# Patient Record
Sex: Male | Born: 1950 | Race: White | Hispanic: No | Smoking: Former smoker
Health system: Southern US, Community
[De-identification: ages and names within clinical notes are randomized; demographics above are authoritative.]

## PROBLEM LIST (undated history)

## (undated) DIAGNOSIS — R531 Weakness: Secondary | ICD-10-CM

## (undated) DIAGNOSIS — R2681 Unsteadiness on feet: Secondary | ICD-10-CM

## (undated) DIAGNOSIS — R4586 Emotional lability: Secondary | ICD-10-CM

## (undated) DIAGNOSIS — E785 Hyperlipidemia, unspecified: Secondary | ICD-10-CM

## (undated) DIAGNOSIS — F329 Major depressive disorder, single episode, unspecified: Secondary | ICD-10-CM

## (undated) DIAGNOSIS — B070 Plantar wart: Secondary | ICD-10-CM

## (undated) DIAGNOSIS — E039 Hypothyroidism, unspecified: Secondary | ICD-10-CM

## (undated) DIAGNOSIS — E119 Type 2 diabetes mellitus without complications: Secondary | ICD-10-CM

## (undated) DIAGNOSIS — R609 Edema, unspecified: Secondary | ICD-10-CM

## (undated) DIAGNOSIS — G629 Polyneuropathy, unspecified: Secondary | ICD-10-CM

## (undated) DIAGNOSIS — I671 Cerebral aneurysm, nonruptured: Secondary | ICD-10-CM

## (undated) DIAGNOSIS — I10 Essential (primary) hypertension: Secondary | ICD-10-CM

## (undated) DIAGNOSIS — F32A Depression, unspecified: Secondary | ICD-10-CM

## (undated) HISTORY — PX: FOOT SURGERY: SHX648

## (undated) HISTORY — DX: Hypothyroidism, unspecified: E03.9

## (undated) HISTORY — DX: Unsteadiness on feet: R26.81

## (undated) HISTORY — DX: Type 2 diabetes mellitus without complications: E11.9

## (undated) HISTORY — DX: Cerebral aneurysm, nonruptured: I67.1

## (undated) HISTORY — DX: Hyperlipidemia, unspecified: E78.5

## (undated) HISTORY — DX: Emotional lability: R45.86

## (undated) HISTORY — DX: Edema, unspecified: R60.9

## (undated) HISTORY — DX: Weakness: R53.1

## (undated) HISTORY — DX: Depression, unspecified: F32.A

## (undated) HISTORY — DX: Polyneuropathy, unspecified: G62.9

## (undated) HISTORY — PX: OTHER SURGICAL HISTORY: SHX169

## (undated) HISTORY — PX: CERVICAL FUSION: SHX112

## (undated) HISTORY — DX: Plantar wart: B07.0

## (undated) HISTORY — PX: WISDOM TOOTH EXTRACTION: SHX21

## (undated) HISTORY — DX: Essential (primary) hypertension: I10

---
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
## 2005-11-01 ENCOUNTER — Inpatient Hospital Stay (HOSPITAL_COMMUNITY): Admission: EM | Admit: 2005-11-01 | Discharge: 2005-11-12 | Payer: Self-pay | Admitting: Emergency Medicine

## 2005-11-03 DIAGNOSIS — I619 Nontraumatic intracerebral hemorrhage, unspecified: Secondary | ICD-10-CM

## 2005-11-03 HISTORY — DX: Nontraumatic intracerebral hemorrhage, unspecified: I61.9

## 2005-12-12 ENCOUNTER — Encounter: Admission: RE | Admit: 2005-12-12 | Discharge: 2005-12-12 | Payer: Self-pay | Admitting: Neurosurgery

## 2006-07-15 ENCOUNTER — Encounter: Admission: RE | Admit: 2006-07-15 | Discharge: 2006-07-15 | Payer: Self-pay | Admitting: Neurosurgery

## 2006-07-23 ENCOUNTER — Ambulatory Visit (HOSPITAL_COMMUNITY): Admission: RE | Admit: 2006-07-23 | Discharge: 2006-07-23 | Payer: Self-pay | Admitting: Radiology

## 2008-01-15 IMAGING — XA IR ANGIO/CAROTID/CERV BI
1 series · 16 of 24 positions shown · non-contrast
Comparison: 11/12/05.

CLINICAL DATA: Evaluate JEBSEN.  
FOUR VESSEL CEREBRAL ANGIOGRAM 07/23/06:
Anesthesia:  Moderate sedation was provided with 1 mg Versed and 25 mcg fentanyl.  
Radiation exposure:  8.9 minutes of fluoroscopy time was utilized.

[Series 1: run · 16 of 176 slices shown]
[im 1/176]
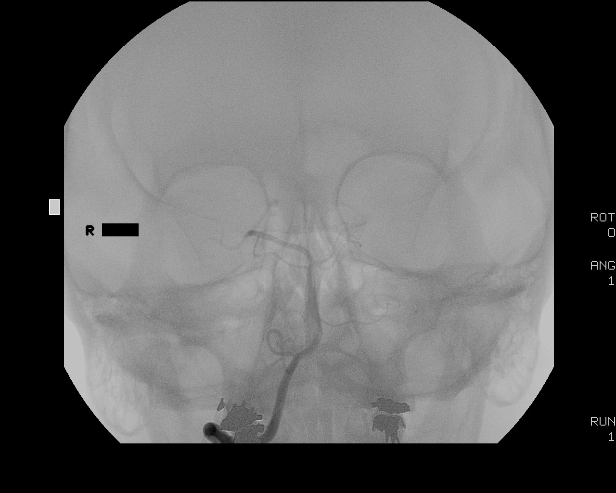
[im 16/176]
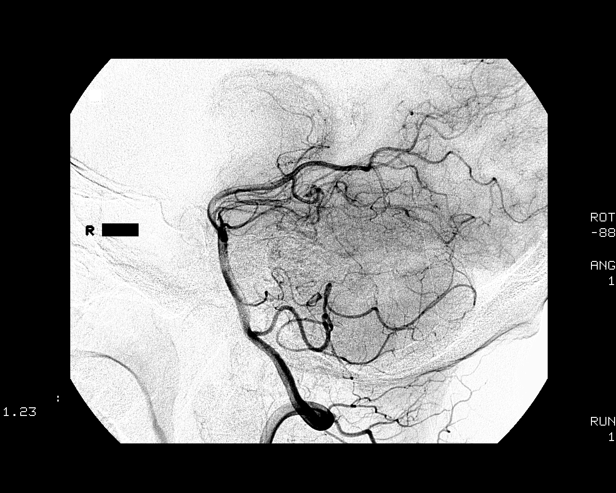
[im 23/176]
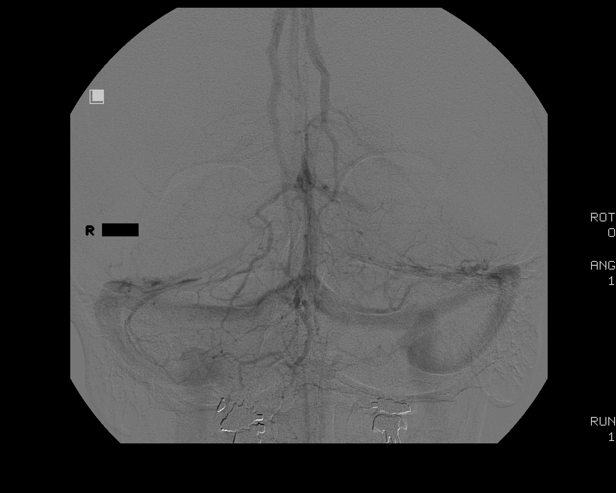
[im 39/176]
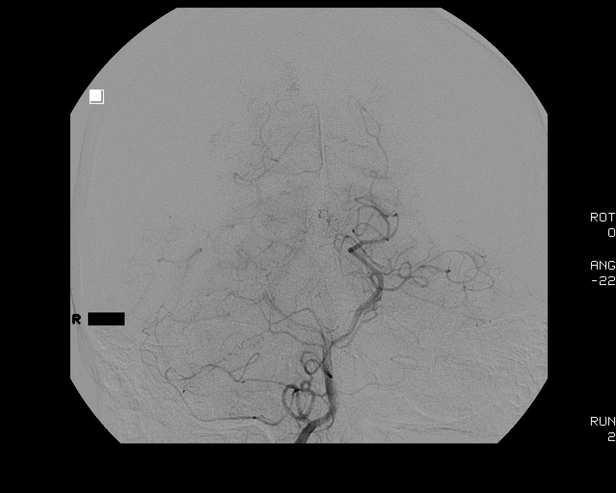
[im 46/176]
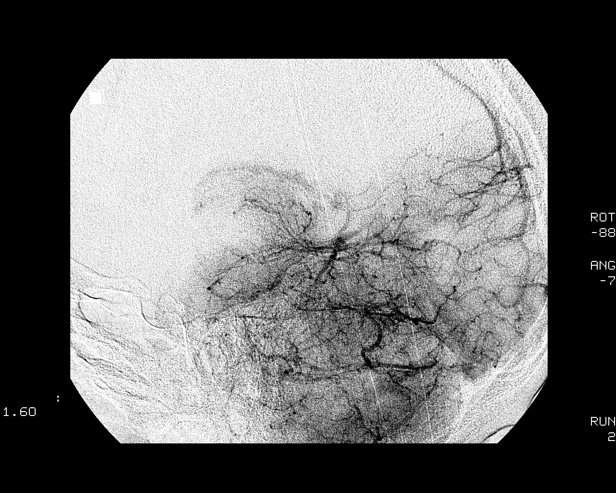
[im 61/176]
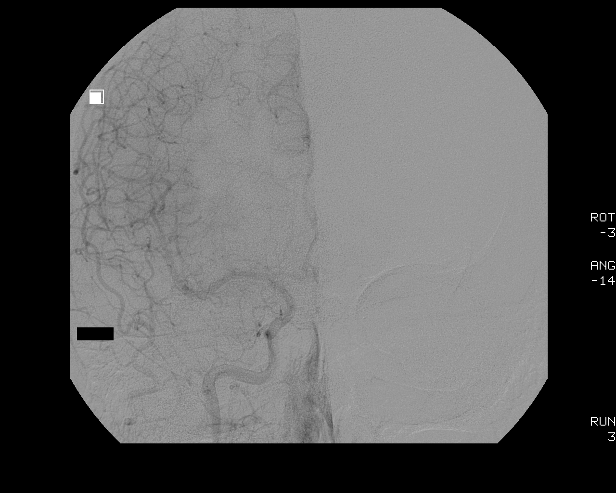
[im 69/176]
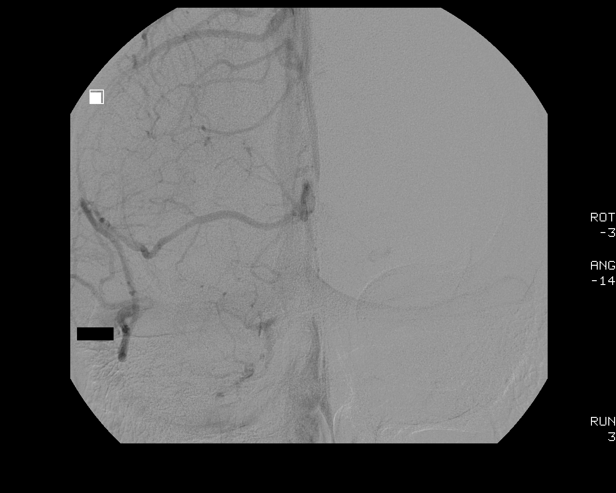
[im 84/176]
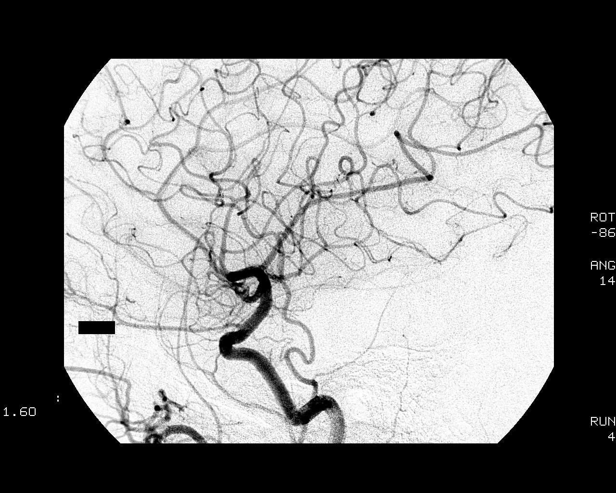
[im 92/176]
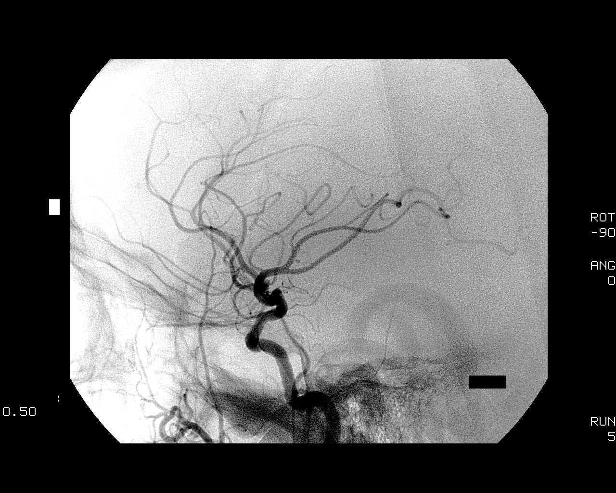
[im 107/176]
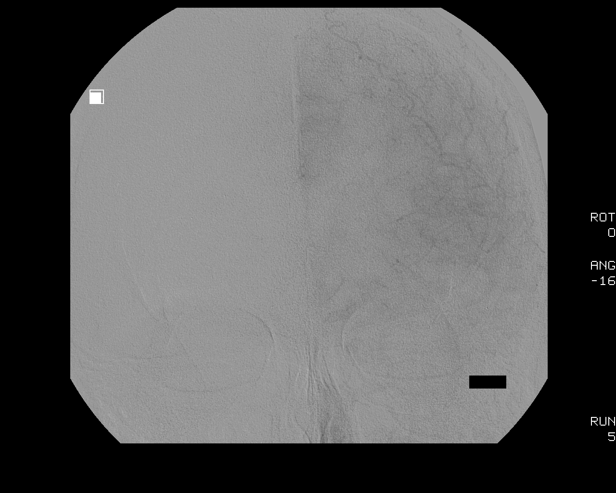
[im 115/176]
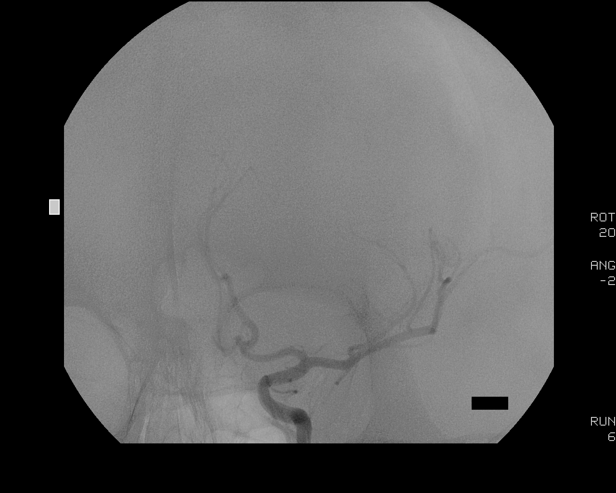
[im 130/176]
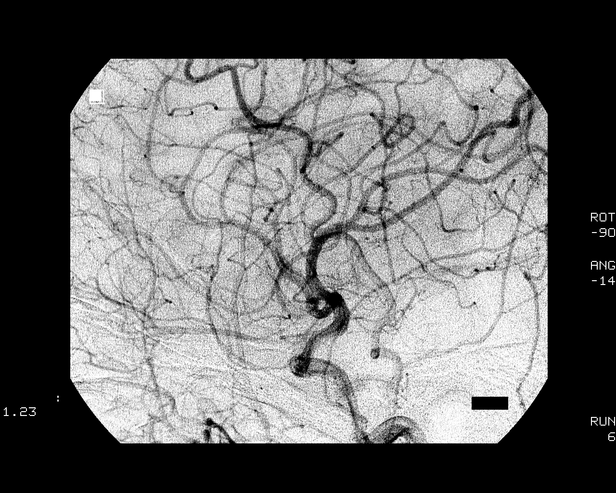
[im 137/176]
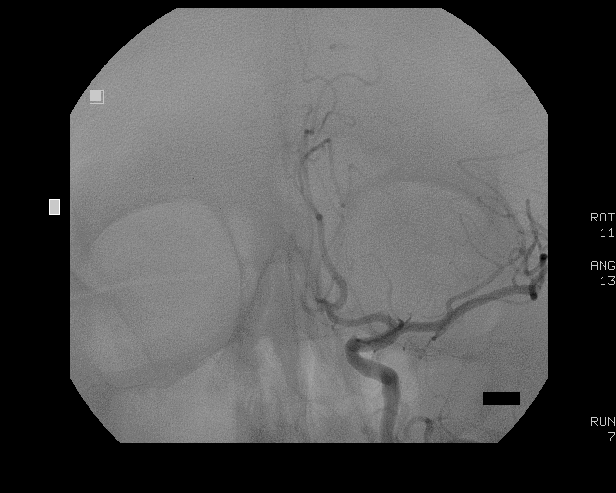
[im 153/176]
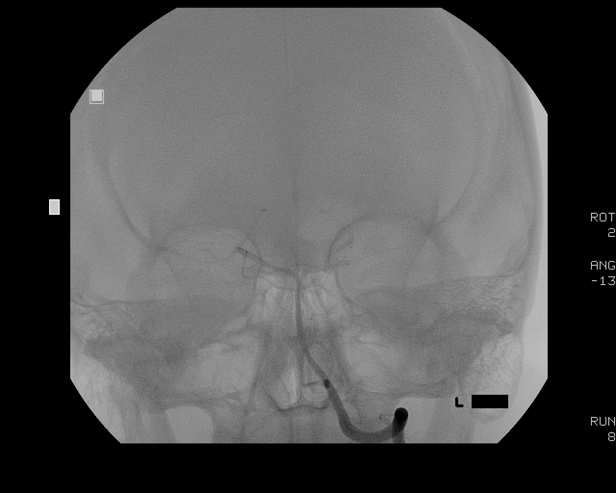
[im 160/176]
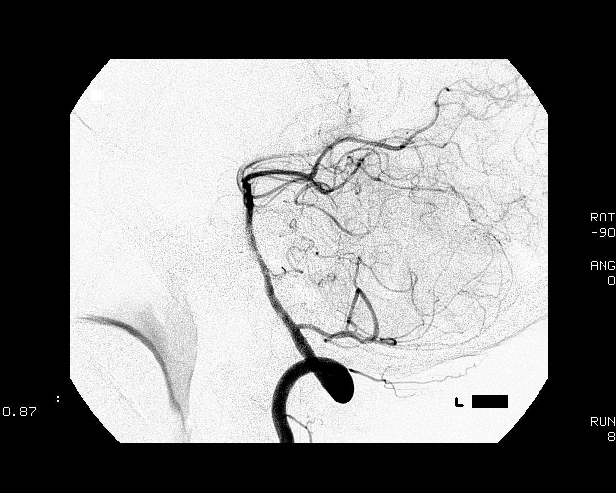
[im 176/176]
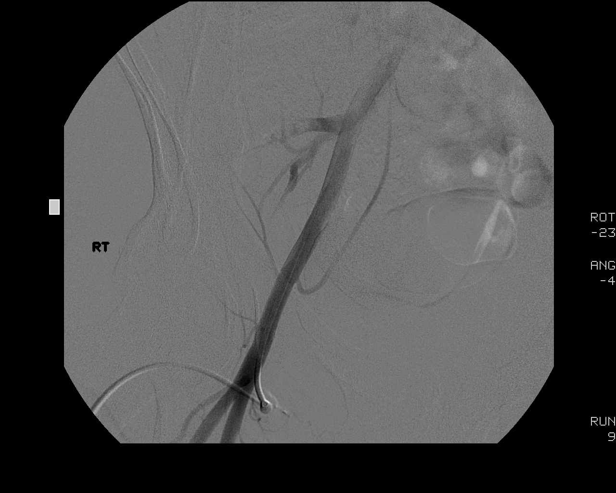

[16 of 24 positions shown; findings below may reference images not displayed]

After obtaining informed written consent for the procedure and sedation, the patient was brought to fluoroscopy.  He was placed on the table in the prone position.  The groin was prepped and draped in the usual sterile fashion.  The superficial soft tissues overlying the right common femoral were anesthetized with 1% lidocaine.  A small skin incision was made.  An arteriotomy was performed with a micropuncture needle and sequentially dilated using a modified Seldinger technique to the ultimate placement of a 5 French sheath.  A 5 French Davis catheter was then deployed over an 0.035 J wire.  The catheter was cleared in the descending thoracic aorta.  
  The right subclavian artery was initially selected and then using roadmap guidance the right vertebral artery was selected over an 0.035 glide wire.  Two intracranial injections of the right vertebral artery were performed.  
The right common carotid artery was next selected.  Two intracranial runs were performed.  
  The left common carotid artery was next selected.  A total of three intracranial runs were performed.  
The left subclavian artery was ultimately selected.  Using roadmap guidance and a 0.035 glidewire the left vertebral artery was selected.  A single intracranial run was performed.  
  A final run of the right common femoral artery was performed via the sheath.  The puncture site was greater 1 cm above the common femoral artery bifurcation.  Therefore StarClose device was deployed after re-prepping the groin and changing gloves.  
Patient was then observed for two hours prior to discharge in stable neurologic condition.
FINDINGS: Right vertebral artery ? Two intracranial injections demonstrate no evidence of aneurysm, stenosis or branch vessel occlusion.  The PICA is the dominant vessel on the right.  Both posterior cerebral arteries originate from the basilar tip.  There is a common origin of the left posterior cerebral artery and superior cerebellar artery as previously seen.  
Right common carotid artery ? There is flash filling across the anterior communicating artery.  The patient?s aneurysm is less well visualized from this injection as was previously seen.  No other focal stenosis, aneurysm or branch vessel occlusion is evident.  There is no significant posterior communicating artery filling.  The dural sinus is normally filled.  
Left common carotid artery ? A 4 to 5 mm anterior communicating artery is re-demonstrated.  This is stable in size compared to the studies November 2005.  Measurements provided at that time under estimated the actual size of the aneurysm but a comparison with the adjacent vessel there has been minimal if any appreciable change in the size of this aneurysm.  There may be some Rausch towards looking for growth given the findings on the MRA recently performed.  Objectively measuring relative measurements no appreciable growth is seen.  
No other aneurysms are identified.  There are no focal stenoses or branch vessel occlusions. 
Left vertebral artery ? A single intracranial injection demonstrates codominant PICA system.  There are no aneurysms to explain the patient?s previous hemorrhage.  Posterior circulation is as described from the right-sided injection.
IMPRESSION: 1.  Stable appearance of 4 to 5 mm anterior communicating artery aneurysm.  Previous measurements likely underestimated the size of the aneurysm but relative size compared with the adjacent anterior cerebral vessels is stable.  
2.  No posterior circulation aneurysm to explain the patient?s previous hemorrhage.  
3.  Right femoral artery StarClose device deployed.  This is the patient?s second StarClose device in that artery.

## 2009-06-14 ENCOUNTER — Encounter (INDEPENDENT_AMBULATORY_CARE_PROVIDER_SITE_OTHER): Payer: Self-pay | Admitting: *Deleted

## 2010-05-27 ENCOUNTER — Encounter: Payer: Self-pay | Admitting: Neurosurgery

## 2010-06-05 NOTE — Letter (Signed)
Summary: Colonoscopy Date Change Letter  [facility]  [address] [place], [place] [address]   Phone: [phone]  Fax: [phone]      June 14, 2009 MRN: [id number]   JIBRAN CROOKSHANKS [address] [place], [place]  [address]   Dear Mr. Nelles,   Previously you were recommended to have a repeat colonoscopy around this time. Your chart was recently reviewed by Dr. Judie Petit T. Russella Dar of [facility]. Follow up colonoscopy is now recommended in March 2014. This revised recommendation is based on current, nationally recognized guidelines for colorectal cancer screening and polyp surveillance. These guidelines are endorsed by the American Cancer Society, [employer] on Colorectal Cancer as well as numerous other major medical organizations.  Please understand that our recommendation assumes that you do not have any new symptoms such as bleeding, a change in bowel habits, anemia, or significant abdominal discomfort. If you do have any concerning GI symptoms or want to discuss the guideline recommendations, please call to arrange an office visit at your earliest convenience. Otherwise we will keep you in our reminder system and contact you 1-2 months prior to the date listed above to schedule your next colonoscopy.  Thank you,  Judie Petit T. Russella Dar, M.D.  [facility] [phone]

## 2012-07-07 ENCOUNTER — Encounter: Payer: Self-pay | Admitting: Gastroenterology

## 2013-11-30 ENCOUNTER — Encounter: Payer: Self-pay | Admitting: Gastroenterology

## 2016-02-08 ENCOUNTER — Other Ambulatory Visit (HOSPITAL_COMMUNITY): Payer: Self-pay | Admitting: Internal Medicine

## 2016-02-08 ENCOUNTER — Ambulatory Visit (HOSPITAL_COMMUNITY)
Admission: RE | Admit: 2016-02-08 | Discharge: 2016-02-08 | Disposition: A | Discharge status: Home / Self Care | Payer: BLUE CROSS/BLUE SHIELD | Source: Ambulatory Visit | Attending: Vascular Surgery | Admitting: Vascular Surgery

## 2016-02-08 DIAGNOSIS — I739 Peripheral vascular disease, unspecified: Secondary | ICD-10-CM | POA: Diagnosis not present

## 2019-06-04 ENCOUNTER — Ambulatory Visit: Payer: BLUE CROSS/BLUE SHIELD

## 2019-06-09 ENCOUNTER — Ambulatory Visit: Payer: BLUE CROSS/BLUE SHIELD

## 2019-06-12 ENCOUNTER — Ambulatory Visit: Payer: Medicare Other | Attending: Internal Medicine

## 2019-06-12 DIAGNOSIS — Z23 Encounter for immunization: Secondary | ICD-10-CM | POA: Insufficient documentation

## 2019-06-12 NOTE — Progress Notes (Signed)
   Covid-19 Vaccination Clinic  Name:  Douglas Scott    MRN: [id number] DOB: 1950-08-30  06/12/2019  Mr. Douglas Scott was observed post Covid-19 immunization for 15 minutes without incidence. He was provided with Vaccine Information Sheet and instruction to access the V-Safe system.   Mr. Douglas Scott was instructed to call 911 with any severe reactions post vaccine: Marland Kitchen Difficulty breathing  . Swelling of your face and throat  . A fast heartbeat  . A bad rash all over your body  . Dizziness and weakness    Immunizations Administered    Name Date Dose VIS Date Route   Pfizer COVID-19 Vaccine 06/12/2019  9:02 AM 0.3 mL 04/16/2019 Intramuscular   Manufacturer: [employer], [employer]   Lot: [id number]   NDC: [address]-0

## 2019-07-06 ENCOUNTER — Ambulatory Visit: Payer: Medicare Other | Attending: Internal Medicine

## 2019-07-06 ENCOUNTER — Ambulatory Visit: Payer: BLUE CROSS/BLUE SHIELD

## 2019-07-06 DIAGNOSIS — Z23 Encounter for immunization: Secondary | ICD-10-CM

## 2019-07-06 NOTE — Progress Notes (Signed)
   Covid-19 Vaccination Clinic  Name:  Douglas Scott    MRN: [id number] DOB: 19-Nov-1950  07/06/2019  [name] was observed post Covid-19 immunization for 15 minutes without incident. He was provided with Vaccine Information Sheet and instruction to access the V-Safe system.   [name] was instructed to call 911 with any severe reactions post vaccine: Marland Kitchen Difficulty breathing  . Swelling of face and throat  . A fast heartbeat  . A bad rash all over body  . Dizziness and weakness   Immunizations Administered    Name Date Dose VIS Date Route   Pfizer COVID-19 Vaccine 07/06/2019  1:08 PM 0.3 mL 04/16/2019 Intramuscular   Manufacturer: [employer], [employer]   Lot: [id number]   NDC: [address]-2

## 2019-09-14 ENCOUNTER — Encounter: Payer: Self-pay | Admitting: *Deleted

## 2019-09-14 ENCOUNTER — Other Ambulatory Visit: Payer: Self-pay | Admitting: *Deleted

## 2019-09-15 ENCOUNTER — Encounter: Payer: Self-pay | Admitting: Diagnostic Neuroimaging

## 2019-09-15 ENCOUNTER — Ambulatory Visit: Payer: Medicare Other | Admitting: Diagnostic Neuroimaging

## 2019-09-15 ENCOUNTER — Other Ambulatory Visit: Payer: Self-pay

## 2019-09-15 ENCOUNTER — Telehealth: Payer: Self-pay | Admitting: Diagnostic Neuroimaging

## 2019-09-15 VITALS — BP 122/59 | HR 73 | Temp 97.2°F | Ht 72.0 in | Wt 213.0 lb

## 2019-09-15 DIAGNOSIS — R200 Anesthesia of skin: Secondary | ICD-10-CM | POA: Diagnosis not present

## 2019-09-15 DIAGNOSIS — R531 Weakness: Secondary | ICD-10-CM | POA: Diagnosis not present

## 2019-09-15 NOTE — Progress Notes (Signed)
[facility]  PATIENT: Douglas Scott DOB: [birth year]  REFERRING CLINICIAN: Shon Baton, MD HISTORY FROM: patient  REASON FOR VISIT: new consult    HISTORICAL  CHIEF COMPLAINT:  Chief Complaint  Patient presents with  . Polyneuropathy    rm 7 New Pt "unsteady gait, numbness/weakness in fingers/hands/legs from knee down; brother has Charcot Marie tooth disease"    HISTORY OF PRESENT ILLNESS:   69 year old male with history of insulin-dependent diabetes since 1975, here for evaluation of numbness, weakness in legs and hands.  Patient noticed onset of heaviness in his legs starting in 2019.  He also noticed some fatigue and numbness and tingling.  He was found to have B12 267 in October 2020 and started vitamin B12 replacement.  Vitamin D level also was low.  His A1c's have ranged in the 5-6 range for many years but have gone up to slightly more than 7 in the last 1 to 2 years.  Patient now having difficulty with gait and balance.  Is having trouble with coordination of his fingers and hands.  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  He underwent cervical spine fusion surgery at that time.    REVIEW OF SYSTEMS: Full 14 system review of systems performed and negative with exception of: As per HPI.  ALLERGIES: No Known Allergies  HOME MEDICATIONS: Outpatient Medications Prior to Visit  Medication Sig Dispense Refill  . atorvastatin (LIPITOR) 40 MG tablet Take 40 mg by mouth daily.    Marland Kitchen HUMALOG KWIKPEN 100 UNIT/ML KwikPen 15 Units 3 (three) times daily.    . hydrochlorothiazide (HYDRODIURIL) 25 MG tablet Take 25 mg by mouth daily.    Marland Kitchen LANTUS SOLOSTAR 100 UNIT/ML Solostar Pen Inject 30 Units into the skin at bedtime.    Marland Kitchen levothyroxine (SYNTHROID) 175 MCG tablet Take 175 mcg by mouth daily.    Marland Kitchen losartan (COZAAR) 100 MG tablet Take 100 mg by mouth daily.    . sertraline (ZOLOFT) 50 MG tablet Take 50 mg by mouth  daily.     No facility-administered medications prior to visit.    PAST MEDICAL HISTORY: Past Medical History:  Diagnosis Date  . Cerebral aneurysm   . Depression   . 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 Edema    LE  . Gait instability   . Hyperlipidemia   . Hypertension   . Hypothyroid   . Intracerebral hemorrhage ([place]) 11/2005  . Mood disturbance   . Plantar warts   . Polyneuropathy   . Weakness     PAST SURGICAL HISTORY: Past Surgical History:  Procedure Laterality Date  . cataracts  10/2017,11/2017  . CERVICAL FUSION     w/bone graft, 20 yrs ago  . FOOT SURGERY Right    bone graft/ fracture  . WISDOM TOOTH EXTRACTION      FAMILY HISTORY: Family History  Problem Relation Age of Onset  . Hypertension Mother   . Atrial fibrillation Mother   . CAD Father   . Lung disease Father   . Charcot-Marie-Tooth disease Brother     SOCIAL HISTORY: Social History   Socioeconomic History  . Marital status: Divorced    Spouse name: Not on file  . Number of children: 2  . Years of education: Not on file  . Highest education level: Not on file  Occupational History    Comment: Engineer, site, retired  Tobacco Use  . Smoking status: Former Smoker    Types: Cigarettes    Quit date: 09/14/2015    Years since quitting: 4.0  . Smokeless tobacco: Never Used  Substance and Sexual Activity  . Alcohol use: Not Currently  . Drug use: Never  . Sexual activity: Not on file  Other Topics Concern  . Not on file  Social History Narrative   Lives alone   Caffeine- coffee 3, coke   Social Determinants of Health   Financial Resource Strain:   . Difficulty of Paying Living Expenses:   Food Insecurity:   . Worried About Charity fundraiser in the Last Year:   . Arboriculturist in the Last Year:   Transportation Needs:   . Film/video editor (Medical):   Marland Kitchen Lack of Transportation (Non-Medical):   Physical Activity:   . Days of Exercise  per Week:   . Minutes of Exercise per Session:   Stress:   . Feeling of Stress :   Social Connections:   . Frequency of Communication with Friends and Family:   . Frequency of Social Gatherings with Friends and Family:   . Attends Religious Services:   . Active Member of Clubs or Organizations:   . Attends Archivist Meetings:   Marland Kitchen Marital Status:   Intimate Partner Violence:   . Fear of Current or Ex-Partner:   . Emotionally Abused:   Marland Kitchen Physically Abused:   . Sexually Abused:      PHYSICAL EXAM  GENERAL EXAM/CONSTITUTIONAL: Vitals:  Vitals:   09/15/19 0805  BP: (!) 122/59  Pulse: 73  Temp: (!) 97.2 F (36.2 C)  Weight: 213 lb (96.6 kg)  Height: 6' (1.829 m)     Body mass index is 28.89 kg/m. Wt Readings from Last 3 Encounters:  09/15/19 213 lb (96.6 kg)     Patient is in no distress; well developed, nourished and groomed; neck is supple  CARDIOVASCULAR:  Examination of carotid arteries is normal; no carotid bruits  Regular rate and rhythm, no murmurs  Examination of peripheral vascular system by observation and palpation is normal  EYES:  Ophthalmoscopic exam of optic discs and posterior segments is normal; no papilledema or hemorrhages  No exam data present  MUSCULOSKELETAL:  Gait, strength, tone, movements noted in Neurologic exam below  NEUROLOGIC: MENTAL STATUS:  No flowsheet data found.  awake, alert, oriented to person, place and time  recent and remote memory intact  normal attention and concentration  language fluent, comprehension intact, naming intact  fund of knowledge appropriate  CRANIAL NERVE:   2nd - no papilledema on fundoscopic exam  2nd, 3rd, 4th, 6th - pupils equal and reactive to light, visual fields full to confrontation, extraocular muscles intact, no nystagmus  5th - facial sensation symmetric  7th - facial strength symmetric  8th - hearing intact  9th - palate elevates symmetrically, uvula  midline  11th - shoulder shrug symmetric  12th - tongue protrusion midline  MOTOR:   normal bulk and tone, full strength in the BUE, BLE; EXCEPT FINGER EXT 4; BILATERAL THUMB ABDUCTION 4; LEFT HIP FLEXION 4+; RIGHT DF 3; LEFT DF 2-3; TOE EXT 2  SENSORY:   normal and symmetric to light touch, pinprick, temperature, vibration; EXCEPT DECR IN FINGERTIPS   COORDINATION:   finger-nose-finger, fine finger movements normal  REFLEXES:   deep tendon reflexes --> RUE 2, LUE 3, KNEES 3; ANKLES 0  GAIT/STATION:   WIDE BASED GAIT; CANNOT  WALK ON TOES OR HEELS; romberg is negative     DIAGNOSTIC DATA (LABS, IMAGING, TESTING) - I reviewed patient records, labs, notes, testing and imaging myself where available.  No results found for: WBC, HGB, HCT, MCV, PLT No results found for: NA, K, CL, CO2, GLUCOSE, BUN, CREATININE, CALCIUM, PROT, ALBUMIN, AST, ALT, ALKPHOS, BILITOT, GFRNONAA, GFRAA No results found for: CHOL, HDL, LDLCALC, LDLDIRECT, TRIG, CHOLHDL No results found for: HGBA1C No results found for: VITAMINB12 No results found for: TSH     ASSESSMENT AND PLAN  69 y.o. year old male here with history of insulin-dependent diabetes since 1975, now with new onset of numbness and weakness in hands and feet since 2019.  We will proceed with neuropathy work-up.  Also history of cervical spine trauma.  Dx:  1. Numbness   2. Weakness     PLAN:  NUMBNESS / WEAKNESS (hands / feet) - check EMG/NCS - check neuropathy labs - fall precautions reviewed  HYPERREFLEXIA / HISTORY OF CERVICAL SPINE TRAUMA - check MRI cervical spine   Orders Placed This Encounter  Procedures  . MR CERVICAL SPINE W WO CONTRAST  . CBC with diff  . CMP  . Vitamin B12  . MMA  . Homocysteine  . A1c  . TSH  . SPEP with IFE  . ANA w/Reflex  . SSA, SSB  . ESR  . CRP  . RPR  . HIV  . Hepatitis B surface antibody, qualitative  . ANCA  . Copper  . Vitamin B6  . Vitamin B1  . Hepatitis C  antibody  . Hepatitis B core antibody, total  . Hepatitis B surface antigen  . Anti-parietal antibody  . Intrinsic Factor Antibodies  . NCV with EMG(electromyography)    Return for for NCV/EMG.    Penni Bombard, MD 8/75/7972, 8:20 AM Certified in Neurology, Neurophysiology and Neuroimaging  [facility] [address], [place], [place] [address] [phone]

## 2019-09-15 NOTE — Patient Instructions (Signed)
  NUMBNESS / WEAKNESS (hands / feet) - check EMG/NCS - check neuropathy labs  HISTORY OF CERVICAL SPINE TRAUMA - check MRI cervical spine

## 2019-09-15 NOTE — Telephone Encounter (Signed)
[employer] medicare order sent to GI. No auth they will reach out to the patient to schedule.  

## 2019-09-21 LAB — CBC WITH DIFFERENTIAL/PLATELET
Basophils Absolute: 0 10*3/uL (ref 0.0–0.2)
Basos: 1 %
EOS (ABSOLUTE): 0.1 10*3/uL (ref 0.0–0.4)
Eos: 1 %
Hematocrit: 43.4 % (ref 37.5–51.0)
Hemoglobin: 15 g/dL (ref 13.0–17.7)
Immature Grans (Abs): 0 10*3/uL (ref 0.0–0.1)
Immature Granulocytes: 0 %
Lymphocytes Absolute: 1.6 10*3/uL (ref 0.7–3.1)
Lymphs: 20 %
MCH: 31.8 pg (ref 26.6–33.0)
MCHC: 34.6 g/dL (ref 31.5–35.7)
MCV: 92 fL (ref 79–97)
Monocytes Absolute: 0.6 10*3/uL (ref 0.1–0.9)
Monocytes: 8 %
Neutrophils Absolute: 5.4 10*3/uL (ref 1.4–7.0)
Neutrophils: 70 %
Platelets: 217 10*3/uL (ref 150–450)
RBC: 4.72 x10E6/uL (ref 4.14–5.80)
RDW: 12 % (ref 11.6–15.4)
WBC: 7.7 10*3/uL (ref 3.4–10.8)

## 2019-09-21 LAB — COMPREHENSIVE METABOLIC PANEL
ALT: 29 IU/L (ref 0–44)
AST: 35 IU/L (ref 0–40)
Albumin/Globulin Ratio: 1.7 (ref 1.2–2.2)
Albumin: 4.3 g/dL (ref 3.8–4.8)
Alkaline Phosphatase: 99 IU/L (ref 39–117)
BUN/Creatinine Ratio: 19 (ref 10–24)
BUN: 16 mg/dL (ref 8–27)
Bilirubin Total: 0.4 mg/dL (ref 0.0–1.2)
CO2: 25 mmol/L (ref 20–29)
Calcium: 9.5 mg/dL (ref 8.6–10.2)
Chloride: 99 mmol/L (ref 96–106)
Creatinine, Ser: 0.83 mg/dL (ref 0.76–1.27)
GFR calc Af Amer: 104 mL/min/{1.73_m2} (ref >59)
GFR calc non Af Amer: 90 mL/min/{1.73_m2} (ref >59)
Globulin, Total: 2.5 g/dL (ref 1.5–4.5)
Glucose: 157 mg/dL — ABNORMAL HIGH (ref 65–99)
Potassium: 4.3 mmol/L (ref 3.5–5.2)
Sodium: 137 mmol/L (ref 134–144)
Total Protein: 6.8 g/dL (ref 6.0–8.5)

## 2019-09-21 LAB — VITAMIN B12: Vitamin B-12: 1961 pg/mL — ABNORMAL HIGH (ref 232–1245)

## 2019-09-21 LAB — HEPATITIS C ANTIBODY: Hep C Virus Ab: <0.1 s/co ratio (ref 0.0–0.9)

## 2019-09-21 LAB — MULTIPLE MYELOMA PANEL, SERUM
Albumin SerPl Elph-Mcnc: 3.7 g/dL (ref 2.9–4.4)
Albumin/Glob SerPl: 1.2 (ref 0.7–1.7)
Alpha 1: 0.3 g/dL (ref 0.0–0.4)
Alpha2 Glob SerPl Elph-Mcnc: 0.9 g/dL (ref 0.4–1.0)
B-Globulin SerPl Elph-Mcnc: 1 g/dL (ref 0.7–1.3)
Gamma Glob SerPl Elph-Mcnc: 1.1 g/dL (ref 0.4–1.8)
Globulin, Total: 3.1 g/dL (ref 2.2–3.9)
IgA/Immunoglobulin A, Serum: 241 mg/dL (ref 61–437)
IgG (Immunoglobin G), Serum: 1080 mg/dL (ref 603–1613)
IgM (Immunoglobulin M), Srm: 75 mg/dL (ref 20–172)

## 2019-09-21 LAB — ANA W/REFLEX: ANA Titer 1: NEGATIVE

## 2019-09-21 LAB — HEPATITIS B SURFACE ANTIBODY,QUALITATIVE: Hep B Surface Ab, Qual: NONREACTIVE

## 2019-09-21 LAB — HOMOCYSTEINE: Homocysteine: 9.4 umol/L (ref 0.0–17.2)

## 2019-09-21 LAB — ANTI-PARIETAL ANTIBODY: Parietal Cell Ab: 15 Units (ref 0.0–20.0)

## 2019-09-21 LAB — PAN-ANCA
ANCA Proteinase 3: <3.5 U/mL (ref 0.0–3.5)
Atypical pANCA: <1:20 {titer}
C-ANCA: <1:20 {titer}
Myeloperoxidase Ab: <9 U/mL (ref 0.0–9.0)
P-ANCA: <1:20 {titer}

## 2019-09-21 LAB — HEMOGLOBIN A1C
Est. average glucose Bld gHb Est-mCnc: 177 mg/dL
Hgb A1c MFr Bld: 7.8 % — ABNORMAL HIGH (ref 4.8–5.6)

## 2019-09-21 LAB — HEPATITIS B SURFACE ANTIGEN: Hepatitis B Surface Ag: NEGATIVE

## 2019-09-21 LAB — INTRINSIC FACTOR ANTIBODIES: Intrinsic Factor Abs, Serum: 1.2 AU/mL — ABNORMAL HIGH (ref 0.0–1.1)

## 2019-09-21 LAB — SEDIMENTATION RATE: Sed Rate: 20 mm/hr (ref 0–30)

## 2019-09-21 LAB — VITAMIN B1: Thiamine: 141.5 nmol/L (ref 66.5–200.0)

## 2019-09-21 LAB — SJOGREN'S SYNDROME ANTIBODS(SSA + SSB)
ENA SSA (RO) Ab: <0.2 AI (ref 0.0–0.9)
ENA SSB (LA) Ab: <0.2 AI (ref 0.0–0.9)

## 2019-09-21 LAB — METHYLMALONIC ACID, SERUM: Methylmalonic Acid: 129 nmol/L (ref 0–378)

## 2019-09-21 LAB — RPR: RPR Ser Ql: NONREACTIVE

## 2019-09-21 LAB — C-REACTIVE PROTEIN: CRP: 3 mg/L (ref 0–10)

## 2019-09-21 LAB — HIV ANTIBODY (ROUTINE TESTING W REFLEX): HIV Screen 4th Generation wRfx: NONREACTIVE

## 2019-09-21 LAB — HEPATITIS B CORE ANTIBODY, TOTAL: Hep B Core Total Ab: NEGATIVE

## 2019-09-21 LAB — TSH: TSH: 1.3 u[IU]/mL (ref 0.450–4.500)

## 2019-09-21 LAB — COPPER, SERUM: Copper: 117 ug/dL (ref 69–132)

## 2019-09-21 LAB — VITAMIN B6: Vitamin B6: 16.6 ug/L (ref 5.3–46.7)

## 2019-09-22 ENCOUNTER — Telehealth: Payer: Self-pay | Admitting: *Deleted

## 2019-09-22 NOTE — Telephone Encounter (Signed)
Spoke with patient and informed him the labs are okay with elevated A1C. He is closely monitored by PCP for this. He verbalized understanding, appreciation.

## 2019-10-14 ENCOUNTER — Encounter: Payer: Medicare Other | Admitting: Diagnostic Neuroimaging

## 2019-10-14 ENCOUNTER — Ambulatory Visit (INDEPENDENT_AMBULATORY_CARE_PROVIDER_SITE_OTHER): Payer: Medicare Other | Admitting: Diagnostic Neuroimaging

## 2019-10-14 DIAGNOSIS — R531 Weakness: Secondary | ICD-10-CM

## 2019-10-14 DIAGNOSIS — Z0289 Encounter for other administrative examinations: Secondary | ICD-10-CM

## 2019-10-14 DIAGNOSIS — R200 Anesthesia of skin: Secondary | ICD-10-CM | POA: Diagnosis not present

## 2019-10-16 ENCOUNTER — Ambulatory Visit
Admission: RE | Admit: 2019-10-16 | Discharge: 2019-10-16 | Disposition: A | Discharge status: Home / Self Care | Payer: Medicare Other | Source: Ambulatory Visit | Attending: Diagnostic Neuroimaging | Admitting: Diagnostic Neuroimaging

## 2019-10-16 ENCOUNTER — Other Ambulatory Visit: Payer: Self-pay

## 2019-10-16 DIAGNOSIS — R531 Weakness: Secondary | ICD-10-CM

## 2019-10-16 DIAGNOSIS — R200 Anesthesia of skin: Secondary | ICD-10-CM

## 2019-10-16 MED ORDER — GADOBENATE DIMEGLUMINE 529 MG/ML IV SOLN
20.0000 mL | Freq: Once | INTRAVENOUS | Status: AC | PRN
  Administered 2019-10-16: 20 mL via INTRAVENOUS

## 2019-10-20 ENCOUNTER — Telehealth: Payer: Self-pay | Admitting: *Deleted

## 2019-10-20 ENCOUNTER — Other Ambulatory Visit: Payer: Self-pay | Admitting: Diagnostic Neuroimaging

## 2019-10-20 DIAGNOSIS — G992 Myelopathy in diseases classified elsewhere: Secondary | ICD-10-CM

## 2019-10-20 NOTE — Telephone Encounter (Signed)
LVM requesting call back for MRI results. 

## 2019-10-20 NOTE — Telephone Encounter (Signed)
Spoke with patient and informed his MRI cevical spine showed spinal stenosis and cord compression changes; Dr Marjory Lies recommends neurosurgery consult. He has placed the referral. I advised if he hasn't heard about scheduling appointment  In a week or so to call back and check on it. Patient verbalized understanding, appreciation.

## 2019-10-21 NOTE — Procedures (Signed)
[facility]  NCS (NERVE CONDUCTION STUDY) WITH EMG (ELECTROMYOGRAPHY) REPORT   STUDY DATE: 10/14/19 PATIENT NAME: Douglas Scott DOB: 1950/10/03 MRN: [id number]  ORDERING CLINICIAN: Joycelyn Schmid, MD   TECHNOLOGIST: Durenda Age ELECTROMYOGRAPHER: Glenford Bayley. Cesily Cuoco, MD  CLINICAL INFORMATION: 69 year old male with numbness and weakness.  FINDINGS: NERVE CONDUCTION STUDY:  Right median motor response has prolonged distal latency, normal amplitude, normal conduction velocity.  Left median motor response has normal distal latency, decreased amplitude, normal conduction velocity.  Right ulnar motor response is normal.  Right peroneal and right tibial motor responses could not be obtained.  Right sural, right superficial peroneal and right median sensory responses could not be obtained.  Left median sensory response has prolonged peak latency and decreased amplitude.  Right ulnar sensory response is normal.  Right ulnar F-wave latency is normal.   NEEDLE ELECTROMYOGRAPHY:  Needle examination of right upper extremity notable for decreased recruitment in right triceps and right first dorsal interosseous muscles at rest.  No abnormal spontaneous activity noted.  Other muscles tested are normal.   IMPRESSION:   Abnormal study demonstrating: -Length dependent axonal sensorimotor polyneuropathy.    INTERPRETING PHYSICIAN:  Suanne Marker, MD Certified in Neurology, Neurophysiology and Neuroimaging  [facility] [address] [place], [place] [address] [phone]  [facility]    Nerve / Sites Muscle Latency Ref. Amplitude Ref. Rel Amp Segments Distance Velocity Ref. Area    ms ms mV mV %  cm m/s m/s mVms  R Median - APB     Wrist APB 5.5 ?4.4 5.5 ?4.0 100 Wrist - APB 7   16.5     Upper arm APB 10.6  4.5  81.1 Upper arm - Wrist 25 49 ?49 14.7  L Median - APB     Wrist APB 4.2 ?4.4 2.3 ?4.0 100 Wrist - APB 7   5.4      Upper arm APB 9.1  1.3  57.9 Upper arm - Wrist 24 50 ?49 3.9  R Ulnar - ADM     Wrist ADM 2.7 ?3.3 10.0 ?6.0 100 Wrist - ADM 7   26.7     B.Elbow ADM 7.1  8.6  86 B.Elbow - Wrist 24 55 ?49 25.2     A.Elbow ADM 8.9  8.4  97.3 A.Elbow - B.Elbow 10 56 ?49 24.8         A.Elbow - Wrist      R Peroneal - EDB     Ankle EDB NR ?6.5 NR ?2.0 NR Ankle - EDB 9   NR     Fib head EDB NR  NR  NR Fib head - Ankle 30 NR ?44 NR     Pop fossa EDB NR  NR  NR Pop fossa - Fib head 10 NR ?44 NR         Pop fossa - Ankle      R Tibial - AH     Ankle AH NR ?5.8 NR ?4.0 NR Ankle - AH 9   NR     Pop fossa AH NR  NR  NR Pop fossa - Ankle 43 NR ?41 NR               SNC    Nerve / Sites Rec. Site Peak Lat Ref.  Amp Ref. Segments Distance    ms ms V V  cm  R Sural - Ankle (Calf)     Calf Ankle NR ?4.4 NR ?6 Calf -  Ankle 14  R Superficial peroneal - Ankle     Lat leg Ankle NR ?4.4 NR ?6 Lat leg - Ankle 14  R Median - Orthodromic (Dig II, Mid palm)     Dig II Wrist NR ?3.4 NR ?10 Dig II - Wrist 13  L Median - Orthodromic (Dig II, Mid palm)     Dig II Wrist 3.9 ?3.4 8 ?10 Dig II - Wrist 13  R Ulnar - Orthodromic, (Dig V, Mid palm)     Dig V Wrist 3.0 ?3.1 7 ?5 Dig V - Wrist 29               F  Wave    Nerve F Lat Ref.   ms ms  R Ulnar - ADM 26.9 ?32.0       EMG Summary Table    Spontaneous MUAP Recruitment  Muscle IA Fib PSW Fasc Other Amp Dur. Poly Pattern  R. Deltoid Normal None None None _______ Normal Normal Normal Normal  R. Biceps brachii Normal None None None _______ Normal Normal Normal Normal  R. Triceps brachii Normal None None None _______ Normal Normal Normal Reduced  R. Flexor carpi radialis Normal None None None _______ Normal Normal Normal Normal  R. First dorsal interosseous Normal None None None _______ Increased Normal Normal Reduced

## 2019-11-01 ENCOUNTER — Telehealth: Payer: Self-pay | Admitting: Diagnostic Neuroimaging

## 2019-11-01 NOTE — Telephone Encounter (Signed)
Patient stopped by the lobby. Would like a call back from Douglas Scott about his referral for neuro surgery consult and records being sent over there as he has not heard from them. His best call back is [phone]

## 2019-11-01 NOTE — Telephone Encounter (Signed)
Called patient and informed him Douglas Scott faxed over referral on 10/21/19 to [facility]. He stated he talked to them Fri and was told they didn't have information. He'll call back and if they've not received [facility] notes he'll have them call Douglas Scott to get refaxed. Patient verbalized understanding, appreciation.

## 2024-06-11 ENCOUNTER — Encounter (INDEPENDENT_AMBULATORY_CARE_PROVIDER_SITE_OTHER): Payer: Self-pay
